# Patient Record
Sex: Female | Born: 1970 | Race: White | Hispanic: Yes | Marital: Married | Smoking: Never smoker
Health system: Southern US, Community
[De-identification: ages and names within clinical notes are randomized; demographics above are authoritative.]

---

## 2005-04-25 ENCOUNTER — Ambulatory Visit (HOSPITAL_COMMUNITY): Admission: RE | Admit: 2005-04-25 | Discharge: 2005-04-25 | Payer: Self-pay | Admitting: Obstetrics

## 2006-03-06 ENCOUNTER — Ambulatory Visit (HOSPITAL_COMMUNITY): Admission: RE | Admit: 2006-03-06 | Discharge: 2006-03-06 | Payer: Self-pay | Admitting: *Deleted

## 2006-03-09 ENCOUNTER — Ambulatory Visit (HOSPITAL_COMMUNITY): Admission: RE | Admit: 2006-03-09 | Discharge: 2006-03-09 | Payer: Self-pay | Admitting: Obstetrics

## 2006-10-23 ENCOUNTER — Inpatient Hospital Stay (HOSPITAL_COMMUNITY): Admission: AD | Admit: 2006-10-23 | Discharge: 2006-10-25 | Payer: Self-pay | Admitting: Obstetrics

## 2007-12-25 ENCOUNTER — Ambulatory Visit (HOSPITAL_COMMUNITY): Admission: RE | Admit: 2007-12-25 | Discharge: 2007-12-25 | Payer: Self-pay | Admitting: Obstetrics

## 2008-05-21 ENCOUNTER — Inpatient Hospital Stay (HOSPITAL_COMMUNITY): Admission: AD | Admit: 2008-05-21 | Discharge: 2008-05-23 | Payer: Self-pay | Admitting: Obstetrics

## 2010-11-16 ENCOUNTER — Ambulatory Visit
Admission: RE | Admit: 2010-11-16 | Discharge: 2010-11-16 | Disposition: A | Discharge status: Home / Self Care | Payer: No Typology Code available for payment source | Source: Ambulatory Visit | Attending: Specialist | Admitting: Specialist

## 2010-11-16 ENCOUNTER — Other Ambulatory Visit: Payer: Self-pay | Admitting: Specialist

## 2010-11-16 DIAGNOSIS — R7611 Nonspecific reaction to tuberculin skin test without active tuberculosis: Secondary | ICD-10-CM

## 2010-11-16 DIAGNOSIS — R6889 Other general symptoms and signs: Secondary | ICD-10-CM

## 2011-05-22 LAB — CBC
HCT: 36.5
MCHC: 33.3
MCV: 92.5
MCV: 93
RBC: 4.26
RDW: 14.2

## 2014-11-16 ENCOUNTER — Other Ambulatory Visit: Payer: Self-pay | Admitting: Obstetrics and Gynecology

## 2014-11-16 DIAGNOSIS — Z1231 Encounter for screening mammogram for malignant neoplasm of breast: Secondary | ICD-10-CM

## 2014-12-17 ENCOUNTER — Ambulatory Visit (HOSPITAL_COMMUNITY)
Admission: RE | Admit: 2014-12-17 | Discharge: 2014-12-17 | Disposition: A | Discharge status: Home / Self Care | Payer: Self-pay | Source: Ambulatory Visit | Attending: Obstetrics and Gynecology | Admitting: Obstetrics and Gynecology

## 2014-12-17 ENCOUNTER — Encounter (HOSPITAL_COMMUNITY): Payer: Self-pay

## 2014-12-17 VITALS — BP 120/74 | Temp 97.8°F | Ht 62.0 in | Wt 179.6 lb

## 2014-12-17 DIAGNOSIS — Z1239 Encounter for other screening for malignant neoplasm of breast: Secondary | ICD-10-CM

## 2014-12-17 DIAGNOSIS — Z1231 Encounter for screening mammogram for malignant neoplasm of breast: Secondary | ICD-10-CM

## 2014-12-17 NOTE — Patient Instructions (Signed)
Educational materials on self-breast awareness given. Explained to Doris Foley that she did not need a Pap smear today due to last Pap smear was 09/21/2014. Let her know [facility] will cover Pap smears every 3 years unless has a history of abnormal Pap smears. Let patient know the [facility] will follow up with her within the next couple weeks with results by letter or phone. Doris Foley verbalized understanding. Patient escorted to mammography for a screening mammogram.  [place], Kathaleen Maserhristine Poll, RN 9:35 AM

## 2014-12-17 NOTE — Progress Notes (Signed)
No complaints today.  Pap Smear:  Pap smear not completed today. Last Pap smear was 09/21/2014 at the free [facility] cancer screening [facility] and normal. Per patient has no history of an abnormal Pap smear. Last Pap smear result to be scanned into EPIC under media.  Physical exam: Breasts Breasts symmetrical. No skin abnormalities bilateral breasts. No nipple retraction bilateral breasts. No nipple discharge bilateral breasts. No lymphadenopathy. No lumps palpated bilateral breasts. No complaints of pain or tenderness on exam. Patient escorted to mammography for a screening mammogram.        Pelvic/Bimanual No Pap smear completed today since last Pap smear was 09/21/2014. Pap smear not indicated per B[facility]CP guidelines.

## 2015-01-07 ENCOUNTER — Ambulatory Visit (HOSPITAL_BASED_OUTPATIENT_CLINIC_OR_DEPARTMENT_OTHER): Payer: Self-pay

## 2015-01-07 ENCOUNTER — Other Ambulatory Visit: Payer: Self-pay

## 2015-01-07 VITALS — BP 126/76 | HR 62 | Temp 97.6°F | Resp 14 | Ht 61.5 in | Wt 178.4 lb

## 2015-01-07 DIAGNOSIS — Z0000 Encounter for general adult medical examination without abnormal findings: Secondary | ICD-10-CM

## 2015-01-07 LAB — HEMOGLOBIN A1C
Hgb A1c MFr Bld: 6.2 % — ABNORMAL HIGH (ref <5.7)
Mean Plasma Glucose: 131 mg/dL — ABNORMAL HIGH (ref <117)

## 2015-01-07 LAB — GLUCOSE (CC13): GLUCOSE: 113 mg/dL (ref 70–140)

## 2015-01-07 LAB — LIPID PANEL
Cholesterol: 139 mg/dL (ref 0–200)
HDL: 56 mg/dL (ref >=46)
LDL CALC: 64 mg/dL (ref 0–99)
TRIGLYCERIDES: 97 mg/dL (ref <150)
Total CHOL/HDL Ratio: 2.5 Ratio
VLDL: 19 mg/dL (ref 0–40)

## 2015-01-07 NOTE — Progress Notes (Signed)
[facility] and is currently a [facility] patient effective 12/17/2014 .   Clinical Measurements: Patient is 5 ft. 1 1/2 inches, weight 178.4 lbs, waist circumference 37 inches, and hip circumference 43.5 inches.   Medical History: Patient has no history of high cholesterol. Patient does not have a history of hypertension or diabetes. Per patient no diagnosed history of coronary heart disease, heart attack, heart failure, stroke/TIA, vascular disease or congenital heart defects.   Blood Pressure, Self-measurement: Patient states has no reason to check Blood pressure.  Nutrition Assessment: Patient stated that eats 0 to 3 fruits every day. Patient states she eats 2 servings of vegetables a day. Per patient states does eat 3 or more ounces of whole grains daily. Patient stated doesn't eat two or more servings of fish weekly. Patient states she does not drink more than 36 ounces or 450 calories of beverages with added sugars weekly. Patient stated that drinks water. Patient stated she does not watch her salt intake but does not really use much salt.  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 Patient says that she goes to gym four times a week and walks and runs on the treadmill. Patient does around 960 minutes of moderate exercise a week and does around 120 minutes of vigorous exercise.  Smoking Status: Patient has never smoked and is not exposed to smoke.   Quality of Life Assessment: In assessing patient's quality of life she stated that out of the past 30 days that she has felt her health is good all of them. Patient also stated that in the past 30 days that her mental health was good including stress, depression and problems with emotions for all days. Patient did state that out of the past 30 days she felt her physical or mental health had not kept her from doing her usual activities including self-care, work or  recreation.   Plan: Lab work will be done today including a lipid panel, blood glucose, and Hgb A1C. Will call lab results when they are finished. Will discuss risk reduction counseling when call results.

## 2015-01-07 NOTE — Patient Instructions (Signed)
Discussed what will result from lab work and vital signs. Will write lab results on number sheet when results called. Verbalized understanding. 

## 2015-01-08 ENCOUNTER — Telehealth: Payer: Self-pay

## 2015-01-08 NOTE — Telephone Encounter (Signed)
Called to inform about lab work from 01/07/15. Interpreter, Delorise RoyalsJulie Sowell informed patient: cholesterol- 139, HDL- 56, LDL- 64, triglycerides - 97, Bld Glucose -113 and HBG-A1C - 6.2. Did risk reduction counseling concerning carbohydrates and sugars. Patient stated that was interested in Health Coaching to set goal related to not developing diabetes. Set up health Coaching for May 23 at 2:30 PM at cancer center for nutrition and Activity.

## 2015-01-11 ENCOUNTER — Ambulatory Visit: Payer: Self-pay

## 2015-01-11 DIAGNOSIS — Z789 Other specified health status: Secondary | ICD-10-CM

## 2015-01-11 NOTE — Patient Instructions (Signed)
Patient will follow 1200 calorie diet plan. Will review all handouts and exercise/activity book. Will increase fiber in diet. Will decrease Carbohydrates to less than 200 per day. Will increase exercise. Will measure portion sizes. Will call if has any questions. Will call patient in three weeks. Patient verbalized understanding.

## 2015-01-11 NOTE — Progress Notes (Signed)
Patient returns today for Health Coaching regarding Nutrition for her borderline AIC and activity.  NUTRITION: Patient and I went over Know Your Health Numbers again.Patient reviewed A1C handout to see about prediabetes, normal and  diabetes range. Discussed that needed to lose at least 10% of body weight (18 lbs).Discussed increasing fiber in diet, reading labels, increasing activity and serving sizes.Explained to patient and showed her that she is overweight and has BMI of 33.2.  Discussed watching carbohydrates, how to count them, serving sizes and number of carbs per day allowance. Patient reviewed weight /activity chart. Patient went over 1,200 cal diet and we broke it down to the number of servings she could have. Patient received and reviewed the following handouts in Spanish or English: Carb counting Menu, prediabetes, A1C Exam, 1200 calorie meal plan, Serving Portions, and Carb Counting and meal planning. Gave patient measuring cup to measure serving sizes and demonstrated the serving sizes. Also, received cookbook, water bottle, and snack box.  ACTIVITY: Discussed activity and walking. Patient stated that had not been to gym as much as usual. Discussed walking 30 minutes or gym 3 to 5 days a week.  PLAN:  Increasing amount of walking per day and add other exercises to plan, Decrease carbohydrates in diet. Lose weight. Will return in 6 months to check A1C.

## 2018-06-03 ENCOUNTER — Inpatient Hospital Stay (HOSPITAL_COMMUNITY)
Admission: AD | Admit: 2018-06-03 | Discharge: 2018-06-03 | Disposition: A | Discharge status: Home / Self Care | Payer: Self-pay | Source: Ambulatory Visit | Attending: Obstetrics and Gynecology | Admitting: Obstetrics and Gynecology

## 2018-06-03 ENCOUNTER — Other Ambulatory Visit: Payer: Self-pay

## 2018-06-03 ENCOUNTER — Encounter (HOSPITAL_COMMUNITY): Payer: Self-pay

## 2018-06-03 DIAGNOSIS — N939 Abnormal uterine and vaginal bleeding, unspecified: Secondary | ICD-10-CM | POA: Insufficient documentation

## 2018-06-03 DIAGNOSIS — D500 Iron deficiency anemia secondary to blood loss (chronic): Secondary | ICD-10-CM | POA: Insufficient documentation

## 2018-06-03 LAB — URINALYSIS, MICROSCOPIC (REFLEX): RBC / HPF: >50 RBC/hpf (ref 0–5)

## 2018-06-03 LAB — CBC WITH DIFFERENTIAL/PLATELET
BASOS ABS: 0 10*3/uL (ref 0.0–0.1)
BASOS PCT: 0 %
Eosinophils Absolute: 0 10*3/uL (ref 0.0–0.5)
Eosinophils Relative: 0 %
HEMATOCRIT: 21.4 % — AB (ref 36.0–46.0)
HEMOGLOBIN: 7 g/dL — AB (ref 12.0–15.0)
LYMPHS PCT: 16 %
Lymphs Abs: 1.1 10*3/uL (ref 0.7–4.0)
MCH: 25.5 pg — ABNORMAL LOW (ref 26.0–34.0)
MCHC: 32.7 g/dL (ref 30.0–36.0)
MCV: 78.1 fL — AB (ref 80.0–100.0)
Monocytes Absolute: 0.2 10*3/uL (ref 0.1–1.0)
Monocytes Relative: 3 %
NEUTROS ABS: 5.6 10*3/uL (ref 1.7–7.7)
NEUTROS PCT: 81 %
Platelets: 259 10*3/uL (ref 150–400)
RBC: 2.74 MIL/uL — ABNORMAL LOW (ref 3.87–5.11)
RDW: 15.4 % (ref 11.5–15.5)
WBC: 6.9 10*3/uL (ref 4.0–10.5)
nRBC: 0 % (ref 0.0–0.2)

## 2018-06-03 LAB — URINALYSIS, ROUTINE W REFLEX MICROSCOPIC

## 2018-06-03 LAB — POCT PREGNANCY, URINE: PREG TEST UR: NEGATIVE

## 2018-06-03 MED ORDER — MEDROXYPROGESTERONE ACETATE 10 MG PO TABS: 10.0000 mg | ORAL_TABLET | Freq: Every day | ORAL | 0 refills | Status: DC

## 2018-06-03 NOTE — Discharge Instructions (Signed)
Sangrado uterino anormal (Abnormal Uterine Bleeding) El sangrado uterino anormal puede afectar a las mujeres que estn en diversas etapas de la vida, desde adolescentes, mujeres frtiles y Games developer, hasta mujeres que han llegado a la menopausia. Hay diversas clases de sangrado uterino que se consideran anormales, entre ellas:  Prdidas de sangre o [employer] perodos.  Hemorragias luego de Retail banker.  Sangrado abundante o ms que lo habitual.  Perodos que duran ms que lo normal.  Sangrado luego de la menopausia. Muchos casos de sangrado uterino anormal son leves y simples de tratar, mientras que otros son ms graves. El mdico debe evaluar cualquier clase de sangrado anormal. El tratamiento depender de la causa del sangrado. INSTRUCCIONES PARA EL CUIDADO EN EL HOGAR Controle su afeccin para ver si hay cambios. Las siguientes indicaciones ayudarn a Chief Strategy Officer que pueda sentir:  Evite las duchas vaginales y el uso de tampones segn las indicaciones del mdico.  Scioto compresas con frecuencia. Deber hacerse exmenes plvicos regulares y pruebas de Papanicolaou. Cumpla con todas las visitas de control y [employer] diagnsticos, segn le indique su mdico. SOLICITE ATENCIN MDICA SI:  El sangrado dura ms de 1 semana.  Se siente mareada por momentos.  SOLICITE ATENCIN MDICA DE INMEDIATO SI:  Se desmaya.  Debe cambiarse la compresa cada 15 a 30 minutos.  Siente dolor abdominal.  Doris Foley.  Se siente dbil o presenta sudoracin.  Elimina cogulos grandes por la vagina.  Comienza a sentir nuseas y [place].  ASEGRESE DE QUE:  Comprende estas instrucciones.  Controlar su afeccin.  Recibir ayuda de inmediato si no mejora o si empeora.  Esta informacin no tiene Marine scientist el consejo del mdico. Asegrese de hacerle al mdico cualquier pregunta que tenga. Document Released: 08/07/2005  Document Revised: 08/12/2013 Document Reviewed: 03/06/2013 Elsevier Interactive Patient Education  2017 [place].  Anemia Anemia is a condition in which you do not have enough red blood cells or hemoglobin. Hemoglobin is a substance in red blood cells that carries oxygen. When you do not have enough red blood cells or hemoglobin (are anemic), your body cannot get enough oxygen and your organs may not work properly. As a result, you may feel very tired or have other problems. What are the causes? Common causes of anemia include:  Excessive bleeding. Anemia can be caused by excessive bleeding inside or outside the body, including bleeding from the intestine or from periods in women.  Poor nutrition.  Long-lasting (chronic) kidney, thyroid, and liver disease.  Bone marrow disorders.  Cancer and treatments for cancer.  HIV (human immunodeficiency virus) and AIDS (acquired immunodeficiency syndrome).  Treatments for HIV and AIDS.  Spleen problems.  Blood disorders.  Infections, medicines, and autoimmune disorders that destroy red blood cells.  What are the signs or symptoms? Symptoms of this condition include:  Minor weakness.  Dizziness.  Headache.  Feeling heartbeats that are irregular or faster than normal (palpitations).  Shortness of breath, especially with exercise.  Paleness.  Cold sensitivity.  Indigestion.  Nausea.  Difficulty sleeping.  Difficulty concentrating.  Symptoms may occur suddenly or develop slowly. If your anemia is mild, you may not have symptoms. How is this diagnosed? This condition is diagnosed based on:  Blood tests.  Your medical history.  A physical exam.  Bone marrow biopsy.  Your health care provider may also check your stool (feces) for blood and may do additional testing to look for the cause of your bleeding. You may  also have other tests, including:  Imaging tests, such as a CT scan or  MRI.  Endoscopy.  Colonoscopy.  How is this treated? Treatment for this condition depends on the cause. If you continue to lose a lot of blood, you may need to be treated at a hospital. Treatment may include:  Taking supplements of iron, vitamin X82, or folic acid.  Taking a hormone medicine (erythropoietin) that can help to stimulate red blood cell growth.  Having a blood transfusion. This may be needed if you lose a lot of blood.  Making changes to your diet.  Having surgery to remove your spleen.  Follow these instructions at home:  Take over-the-counter and prescription medicines only as told by your health care provider.  Take supplements only as told by your health care provider.  Follow any diet instructions that you were given.  Keep all follow-up visits as told by your health care provider. This is important. Contact a health care provider if:  You develop new bleeding anywhere in the body. Get help right away if:  You are very weak.  You are short of breath.  You have pain in your abdomen or chest.  You are dizzy or feel faint.  You have trouble concentrating.  You have bloody or black, tarry stools.  You vomit repeatedly or you vomit up blood. Summary  Anemia is a condition in which you do not have enough red blood cells or enough of a substance in your red blood cells that carries oxygen (hemoglobin).  Symptoms may occur suddenly or develop slowly.  If your anemia is mild, you may not have symptoms.  This condition is diagnosed with blood tests as well as a medical history and physical exam. Other tests may be needed.  Treatment for this condition depends on the cause of the anemia. This information is not intended to replace advice given to you by your health care provider. Make sure you discuss any questions you have with your health care provider. Document Released: 09/14/2004 Document Revised: 09/08/2016 Document Reviewed: 09/08/2016 Elsevier  Interactive Patient Education  [employer].

## 2018-06-03 NOTE — MAU Provider Note (Signed)
History     CSN: [id number]  Arrival date and time: 06/03/18 1350   None     Chief Complaint  Patient presents with  . Vaginal Bleeding   HPI  Doris Foley is 47 y.o. [id number] presents for evaluation of vaginal bleeding that began 2 weeks ago.  She last had GYN care 2 years ago, doesn't have GYN MD.  She states she feels a little dizzy with bleeding.  Describes as heavy using 3-4 pads daily, has larger than quarter size clots. Neg for cramping/pain. Periods have been regular until this month.  Neg for hx of uterine fibroids.  Hx of low Fe, she reports last few times she has attempted to give blood, she was not able to because of her HGB.  History reviewed. No pertinent past medical history.  History reviewed. No pertinent surgical history.  Family History  Problem Relation Age of Onset  . Diabetes Mother   . Diabetes Father   . Diabetes Brother   . Diabetes Brother     Social History   Tobacco Use  . Smoking status: Never Smoker  . Smokeless tobacco: Never Used  Substance Use Topics  . Alcohol use: Yes    Comment: rarely  . Drug use: No    Allergies: No Known Allergies  No medications prior to admission.    Review of Systems  Constitutional: Negative for activity change, appetite change and fatigue.  Respiratory: Negative for shortness of breath.   Cardiovascular: Negative for chest pain.  Gastrointestinal: Negative for abdominal pain (cramping), nausea and vomiting.  Genitourinary: Positive for vaginal bleeding (heavy with clotting ). Negative for dysuria, frequency and menstrual problem (normal cycles until this month).  Neurological: Positive for light-headedness. Negative for syncope.   Physical Exam   Blood pressure 139/74, pulse 96, temperature 98 F (36.7 C), resp. rate 16, height 5\' 3"  (1.6 m), weight 76.2 kg, last menstrual period 05/19/2018.  Physical Exam  Nursing note and vitals reviewed. Constitutional: She is oriented to person, place, and  time. She appears well-developed and well-nourished. No distress.  HENT:  Head: Normocephalic.  Neck: Normal range of motion.  Cardiovascular: Normal rate.  Respiratory: Effort normal.  GI: Soft. She exhibits no distension and no mass. There is no tenderness. There is no rebound and no guarding.  Genitourinary: There is no rash, tenderness or lesion on the right labia. There is no rash, tenderness or lesion on the left labia. Uterus is not enlarged and not tender. Cervix exhibits no motion tenderness, no discharge and no friability. Right adnexum displays no mass, no tenderness and no fullness. Left adnexum displays no mass, no tenderness and no fullness. There is bleeding (small amount of bright red active bleeding with small clots. ) in the vagina. No tenderness in the vagina.  Neurological: She is alert and oriented to person, place, and time.  Skin: Skin is warm and dry.  Psychiatric: She has a normal mood and affect. Her behavior is normal. Thought content normal.   Results for orders placed or performed during the hospital encounter of 06/03/18 (from the past 24 hour(s))  Pregnancy, urine POC     Status: None   Collection Time: 06/03/18  2:21 PM  Result Value Ref Range   Preg Test, Ur NEGATIVE NEGATIVE  CBC with Differential/Platelet     Status: Abnormal (Preliminary result)   Collection Time: 06/03/18  2:43 PM  Result Value Ref Range   WBC 6.9 4.0 - 10.5 K/uL   RBC 2.74 (  L) 3.87 - 5.11 MIL/uL   Hemoglobin 7.0 (L) 12.0 - 15.0 g/dL   HCT 09.8 (L) 11.9 - 14.7 %   MCV 78.1 (L) 80.0 - 100.0 fL   MCH 25.5 (L) 26.0 - 34.0 pg   MCHC 32.7 30.0 - 36.0 g/dL   RDW 82.9 56.2 - 13.0 %   Platelets 259 150 - 400 K/uL   nRBC 0.0 0.0 - 0.2 %   Neutrophils Relative % 81 %   Neutro Abs 5.6 1.7 - 7.7 K/uL   Lymphocytes Relative 16 %   Lymphs Abs 1.1 0.7 - 4.0 K/uL   Monocytes Relative 3 %   Monocytes Absolute 0.2 0.1 - 1.0 K/uL   Eosinophils Relative 0 %   Eosinophils Absolute 0.0 0.0 - 0.5  K/uL   Basophils Relative 0 %   Basophils Absolute 0.0 0.0 - 0.1 K/uL   Other PENDING %    MAU Course  Procedures  MDM MSE Exam Labs  Assessment and Plan  A:  Abnormal uterine bleeding      Anemia  P:  Rx for Provera 10mg  for 10 days      Instructed to her to buy OTC FE and take bid.  Add stool softner as needed.       Message routed to Clinic to make appointment to establish care/ recheck HGB in 4-6 weeks       Encouraged to stay well hydrated.  Dennison Mascot Kailynne Ferrington 06/03/2018, 2:56 PM

## 2018-06-03 NOTE — MAU Note (Signed)
Pt presents to MAU with complaints of heavy vaginal bleeding for 2 weeks

## 2018-06-03 NOTE — MAU Note (Signed)
Pt c/o increased VB over the past 2 weeks, today with dizziness and "feeling weak".  Pt reports bleeding has gotten heavier and now has clots

## 2018-07-03 ENCOUNTER — Ambulatory Visit (INDEPENDENT_AMBULATORY_CARE_PROVIDER_SITE_OTHER): Payer: Self-pay | Admitting: Family Medicine

## 2018-07-03 ENCOUNTER — Encounter: Payer: Self-pay | Admitting: Family Medicine

## 2018-07-03 DIAGNOSIS — N920 Excessive and frequent menstruation with regular cycle: Secondary | ICD-10-CM | POA: Insufficient documentation

## 2018-07-03 MED ORDER — MEDROXYPROGESTERONE ACETATE 10 MG PO TABS: 10.0000 mg | ORAL_TABLET | Freq: Every day | ORAL | 0 refills | Status: AC

## 2018-07-03 NOTE — Assessment & Plan Note (Signed)
Given her lack of financial assistance--will try to arrange this and then get pap and endometrial sampling. Only 1 episode of bleeding, but bled to Hgb of 7. This is worrisome. Refilled her Provera, take as needed for bleeding. Consider IUD as well for control of bleeding without side effects.

## 2018-07-03 NOTE — Patient Instructions (Signed)
Menorrhagia Menorrhagia is when your menstrual periods are heavy or last longer than usual. Follow these instructions at home:  Only take medicine as told by your doctor.  Take any iron pills as told by your doctor. Heavy bleeding may cause low levels of iron in your body.  Do not take aspirin 1 week before or during your period. Aspirin can make the bleeding worse.  Lie down for a while if you change your tampon or pad more than once in 2 hours. This may help lessen the bleeding.  Eat a healthy diet and foods with iron. These foods include leafy green vegetables, meat, liver, eggs, and whole grain breads and cereals.  Do not try to lose weight. Wait until the heavy bleeding has stopped and your iron level is normal. Contact a doctor if:  You soak through a pad or tampon every 1 or 2 hours, and this happens every time you have a period.  You need to use pads and tampons at the same time because you are bleeding so much.  You need to change your pad or tampon during the night.  You have a period that lasts for more than 8 days.  You pass clots bigger than 1 inch (2.5 cm) wide.  You have irregular periods that happen more or less often than once a month.  You feel dizzy or pass out (faint).  You feel very weak or tired.  You feel short of breath or feel your heart is beating too fast when you exercise.  You feel sick to your stomach (nausea) and you throw up (vomit) while you are taking your medicine.  You have watery poop (diarrhea) while you are taking your medicine.  You have any problems that may be related to the medicine you are taking. Get help right away if:  You soak through 4 or more pads or tampons in 2 hours.  You have any bleeding while you are pregnant. This information is not intended to replace advice given to you by your health care provider. Make sure you discuss any questions you have with your health care provider. Document Released: 05/16/2008 Document  Revised: 01/13/2016 Document Reviewed: 02/06/2013 Elsevier Interactive Patient Education  2017 Elsevier Inc.  

## 2018-07-03 NOTE — Progress Notes (Signed)
   Subjective:    Patient ID: Doris Foley is a 47 y.o. female presenting with Follow-up  on 07/03/2018  HPI: Cycles previously monthly lasted 3-4 days. Has had some heavy bleeding with anemia. Hgb 7. Given Provera. Bleeding stopped after 5 days. Did not like how it made her feel. LMP 06/20/18--cycle getting heavier, took Provera again.  Review of Systems  Constitutional: Negative for chills and fever.  Respiratory: Negative for shortness of breath.   Cardiovascular: Negative for chest pain.  Gastrointestinal: Negative for abdominal pain, nausea and vomiting.  Genitourinary: Negative for dysuria.  Skin: Negative for rash.      Objective:    BP 133/71   Pulse 79   Ht 5\' 3"  (1.6 m)   Wt 171 lb 12.8 oz (77.9 kg)   LMP 06/20/2018 (Exact Date)   BMI 30.43 kg/m  Physical Exam  Constitutional: She is oriented to person, place, and time. She appears well-developed and well-nourished. No distress.  HENT:  Head: Normocephalic and atraumatic.  Eyes: No scleral icterus.  Neck: Neck supple.  Cardiovascular: Normal rate.  Pulmonary/Chest: Effort normal.  Abdominal: Soft.  Neurological: She is alert and oriented to person, place, and time.  Skin: Skin is warm and dry.  Psychiatric: She has a normal mood and affect.        Assessment & Plan:   Problem List Items Addressed This Visit      Unprioritized   Menorrhagia    Given her lack of financial assistance--will try to arrange this and then get pap and endometrial sampling. Only 1 episode of bleeding, but bled to Hgb of 7. This is worrisome. Refilled her Provera, take as needed for bleeding. Consider IUD as well for control of bleeding without side effects.         Total face-to-face time with patient: 20 minutes. Over 50% of encounter was spent on counseling and coordination of care. Return in about 4 weeks (around 07/31/2018) for needs financial assistance asap and f/u in 4 wks.  Doris Foley 07/03/2018 9:34 AM

## 2018-07-12 ENCOUNTER — Other Ambulatory Visit (HOSPITAL_COMMUNITY): Payer: Self-pay | Admitting: *Deleted

## 2018-07-12 DIAGNOSIS — Z1231 Encounter for screening mammogram for malignant neoplasm of breast: Secondary | ICD-10-CM

## 2018-08-07 ENCOUNTER — Ambulatory Visit: Payer: Self-pay | Admitting: Family Medicine

## 2018-09-04 ENCOUNTER — Ambulatory Visit: Payer: Self-pay | Admitting: Family Medicine

## 2018-09-04 ENCOUNTER — Encounter: Payer: Self-pay | Admitting: Family Medicine

## 2018-09-04 ENCOUNTER — Telehealth: Payer: Self-pay | Admitting: *Deleted

## 2018-09-04 NOTE — Progress Notes (Signed)
Patient did not keep appointment today. She will be called to reschedule.  

## 2018-09-04 NOTE — Telephone Encounter (Signed)
Called pt to inform her of missed appointment and to request she call the clinic to reschedule as soon as possible.  Pt did not pick up.  Left a message stating that I was calling to inform her of a missed appointment and request that she call the office to reschedule this appointment as soon as she can.   Letter sent.

## 2018-10-17 ENCOUNTER — Encounter (HOSPITAL_COMMUNITY): Payer: Self-pay

## 2018-10-17 ENCOUNTER — Ambulatory Visit (HOSPITAL_COMMUNITY)
Admission: RE | Admit: 2018-10-17 | Discharge: 2018-10-17 | Disposition: A | Discharge status: Home / Self Care | Payer: Self-pay | Source: Ambulatory Visit | Attending: Obstetrics and Gynecology | Admitting: Obstetrics and Gynecology

## 2018-10-17 ENCOUNTER — Ambulatory Visit
Admission: RE | Admit: 2018-10-17 | Discharge: 2018-10-17 | Disposition: A | Discharge status: Home / Self Care | Payer: No Typology Code available for payment source | Source: Ambulatory Visit | Attending: Obstetrics and Gynecology | Admitting: Obstetrics and Gynecology

## 2018-10-17 VITALS — BP 122/80

## 2018-10-17 DIAGNOSIS — Z1231 Encounter for screening mammogram for malignant neoplasm of breast: Secondary | ICD-10-CM

## 2018-10-17 DIAGNOSIS — Z1239 Encounter for other screening for malignant neoplasm of breast: Secondary | ICD-10-CM

## 2018-10-17 NOTE — Progress Notes (Signed)
No complaints today.   Pap Smear: Pap smear not completed today. Last Pap smear was in July 2018 in [place] and normal per patient. Per patient has no history of an abnormal Pap smear. Last Pap smear result is not in Epic. Previous Pap smear result 09/21/2014 is in Epic.  Physical exam: Breasts Breasts symmetrical. No skin abnormalities bilateral breasts. No nipple retraction bilateral breasts. No nipple discharge bilateral breasts. No lymphadenopathy. No lumps palpated bilateral breasts. No complaints of pain or tenderness on exam. Referred patient to the [facility] for a screening mammogram. Appointment scheduled for Thrusday, October 17, 2018 at 1640.        Pelvic/Bimanual No Pap smear completed today since last Pap smear was in July 2018 per patient. Pap smear not indicated per B[facility] guidelines.   Smoking History: Patient has never smoked.  Patient Navigation: Patient education provided. Access to services provided for patient through [facility] program.   Breast and Cervical Cancer Risk Assessment: Patient has no family history of breast cancer, known genetic mutations, or radiation treatment to the chest before age 76. Patient has no history of cervical dysplasia, immunocompromised, or DES exposure in-utero.  Risk Assessment    Risk Scores      10/17/2018   Last edited by: Lynnell Dike, LPN   5-year risk: 0.5 %   Lifetime risk: 5.3 %

## 2018-10-17 NOTE — Patient Instructions (Signed)
Explained breast self awareness with Blanchard Mane. Patient did not need a Pap smear today due to last Pap smear was in July 2018 per patient. Let her know [facility] will cover Pap smears every 3 years unless has a history of abnormal Pap smears. Referred patient to the [facility] for a screening mammogram. Appointment scheduled for Thrusday, October 17, 2018 at 1640. Patient aware of appointment and will be there. Let patient know the Breast Center will follow up with her within the next couple weeks with results of mammogram by letter or phone. Doris Foley verbalized understanding.  [name], [name], RN 3:19 PM

## 2018-10-23 ENCOUNTER — Encounter (HOSPITAL_COMMUNITY): Payer: Self-pay | Admitting: *Deleted

## 2019-10-08 ENCOUNTER — Other Ambulatory Visit: Payer: Self-pay

## 2019-10-08 DIAGNOSIS — Z1231 Encounter for screening mammogram for malignant neoplasm of breast: Secondary | ICD-10-CM

## 2019-11-06 ENCOUNTER — Ambulatory Visit: Payer: No Typology Code available for payment source

## 2019-11-11 ENCOUNTER — Ambulatory Visit: Payer: Self-pay | Admitting: Student

## 2019-11-11 ENCOUNTER — Ambulatory Visit
Admission: RE | Admit: 2019-11-11 | Discharge: 2019-11-11 | Disposition: A | Discharge status: Home / Self Care | Payer: No Typology Code available for payment source | Source: Ambulatory Visit | Attending: Obstetrics and Gynecology | Admitting: Obstetrics and Gynecology

## 2019-11-11 ENCOUNTER — Other Ambulatory Visit: Payer: Self-pay

## 2019-11-11 ENCOUNTER — Encounter: Payer: Self-pay | Admitting: Student

## 2019-11-11 VITALS — BP 116/74 | Temp 97.8°F | Wt 185.0 lb

## 2019-11-11 DIAGNOSIS — Z1231 Encounter for screening mammogram for malignant neoplasm of breast: Secondary | ICD-10-CM

## 2019-11-11 DIAGNOSIS — Z124 Encounter for screening for malignant neoplasm of cervix: Secondary | ICD-10-CM

## 2019-11-11 NOTE — Progress Notes (Signed)
[name] is a 49 y.o. [phone] female who presents to [facility] clinic today with no complaints.    Pap Smear: Pap smear completed today. Last Pap smear was 2018 in [place] and was normal per patient. Per patient has no history of an abnormal Pap smear. Last Pap smear result is not available in Epic.   Physical exam: Breasts Breasts symmetrical. No skin abnormalities bilateral breasts. No nipple retraction bilateral breasts. No nipple discharge bilateral breasts. No lymphadenopathy. No lumps palpated bilateral breasts.       Pelvic/Bimanual Ext Genitalia No lesions, no swelling and no discharge observed on external genitalia.        Vagina Vagina pink and normal texture. No lesions or discharge observed in vagina.        Cervix Cervix is present. Cervix pink and of normal texture. No discharge observed.    Uterus Uterus is present and palpable. Uterus in normal position and normal size.        Adnexae Bilateral ovaries present and palpable. No tenderness on palpation.         Rectovaginal No rectal exam completed today since patient had no rectal complaints. No skin abnormalities observed on exam.     Smoking History: Patient has never smoked. Not referred to quit line.    Patient Navigation: Patient education provided. Access to services provided for patient through [facility] program.   Colorectal Cancer Screening: Per patient has never had colonoscopy completed No complaints today.    Breast and Cervical Cancer Risk Assessment: Patient does not have family history of breast cancer, known genetic mutations, or radiation treatment to the chest before age 56. Patient does not have history of cervical dysplasia, immunocompromised, or DES exposure in-utero.  Risk Assessment    Risk Scores      11/11/2019 10/17/2018   Last edited by: Narda Rutherford, LPN Stoney Bang H, LPN   5-year risk: 0.5 % 0.5 %   Lifetime risk: 5.3 % 5.3 %          A: [facility]CP exam with pap  smear  P: Referred patient to the [facility] for a screening mammogram. Appointment scheduled later today.  Judeth Horn, NP 11/11/2019 1:22 PM

## 2019-11-12 LAB — CYTOLOGY - PAP
Adequacy: ABSENT
Comment: NEGATIVE
Diagnosis: NEGATIVE
High risk HPV: NEGATIVE

## 2019-11-17 ENCOUNTER — Telehealth: Payer: Self-pay

## 2019-11-17 NOTE — Telephone Encounter (Signed)
Normal pap result letter mailed.  °

## 2020-01-30 IMAGING — MG DIGITAL SCREENING BILATERAL MAMMOGRAM WITH TOMO AND CAD
8 series · 9 of 24 positions shown · non-contrast
Comparison: Previous exam(s).

CLINICAL DATA: Screening.

EXAM:
DIGITAL SCREENING BILATERAL MAMMOGRAM WITH TOMO AND CAD

[R MLO synth-2D]
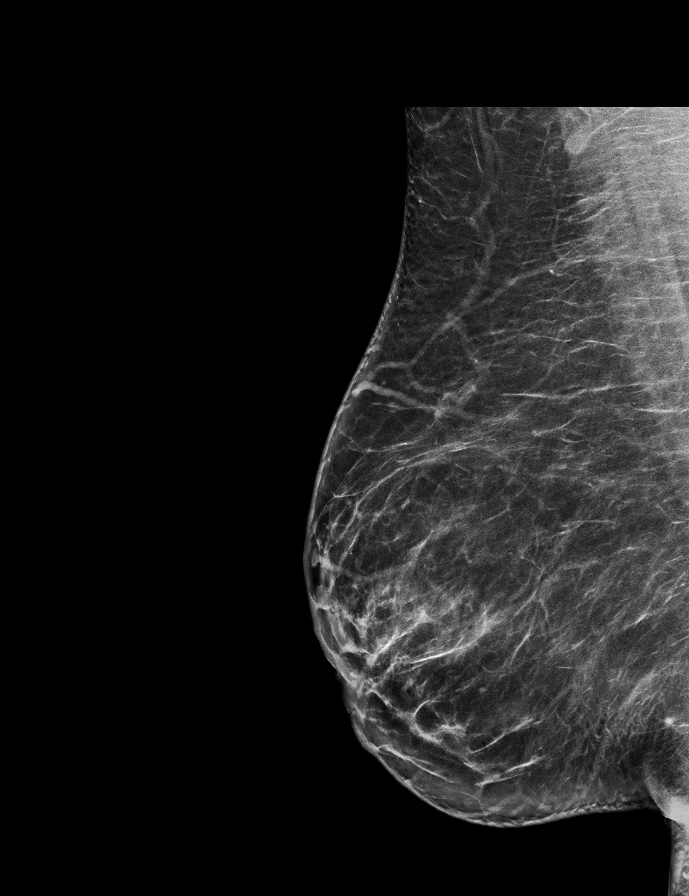

[R CC synth-2D]
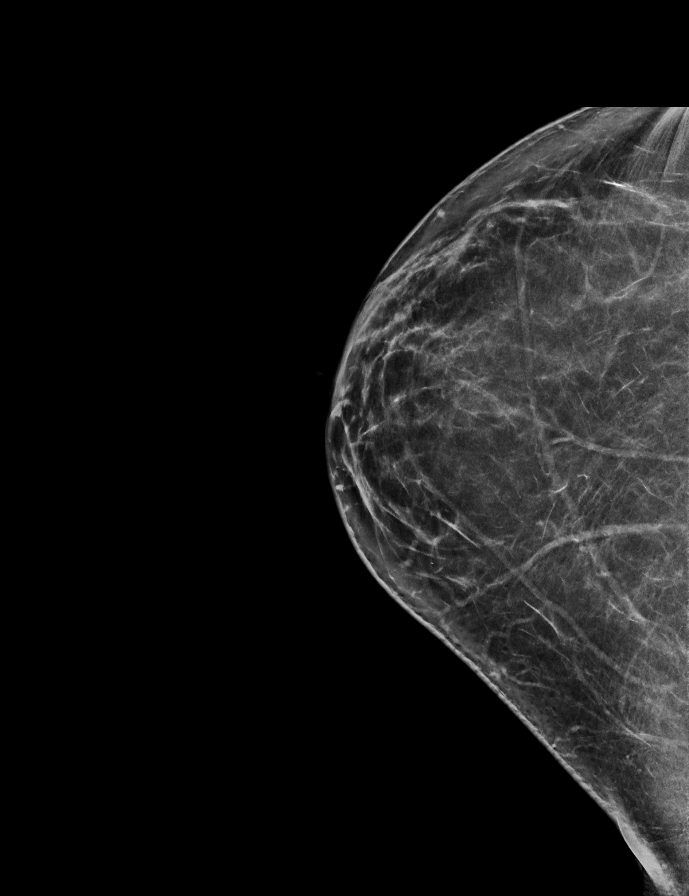

[L MLO synth-2D]
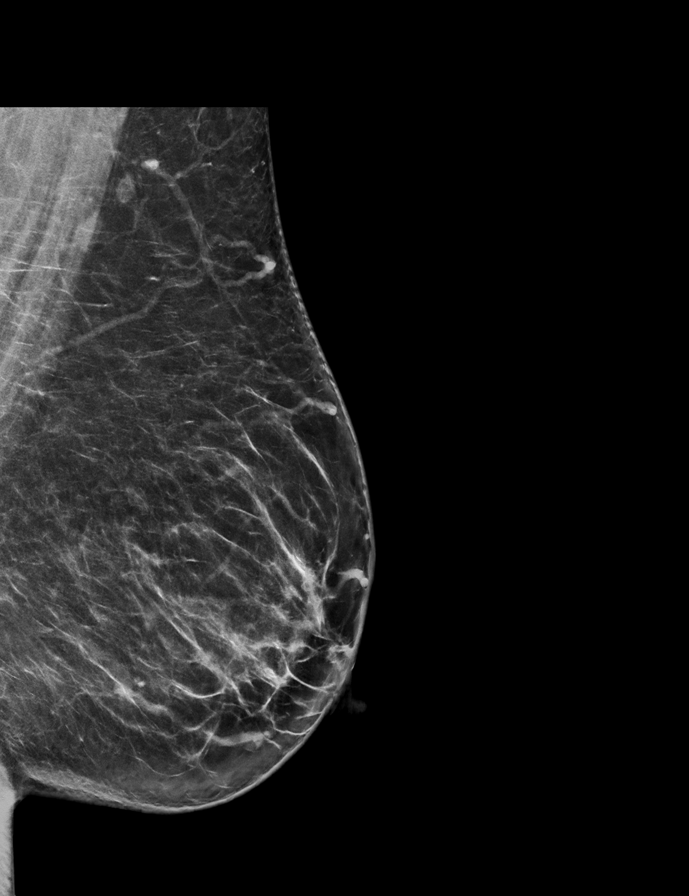

[L CC synth-2D]
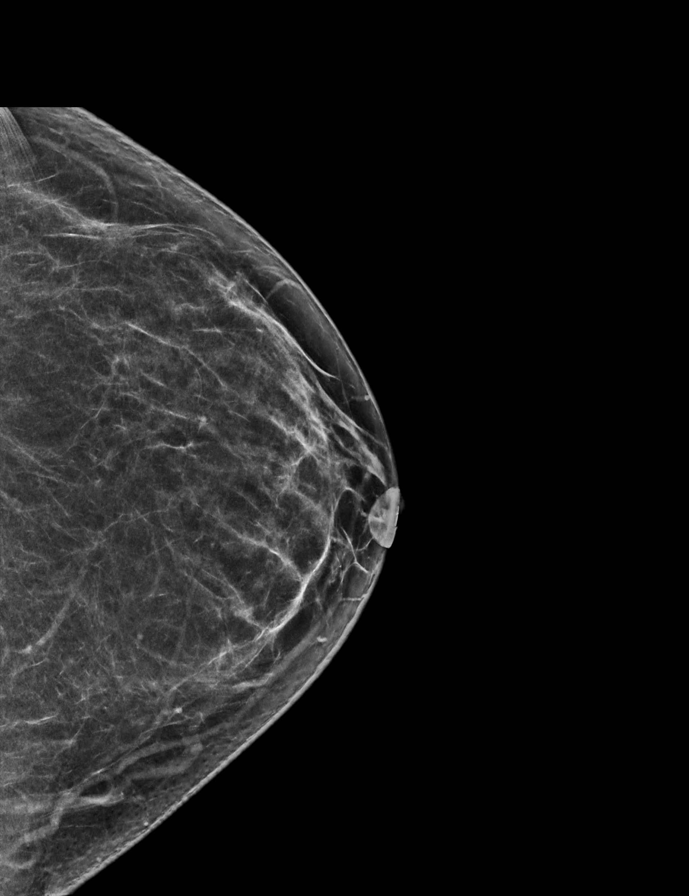

[R CC tomo · 2 of 73 frames shown]
[frame 24/73]
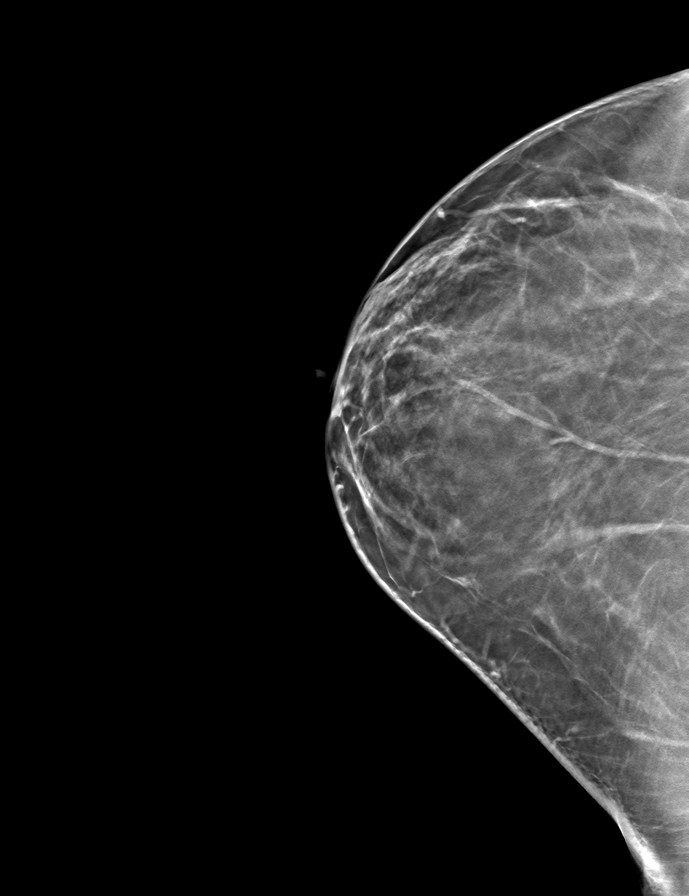
[frame 37/73]
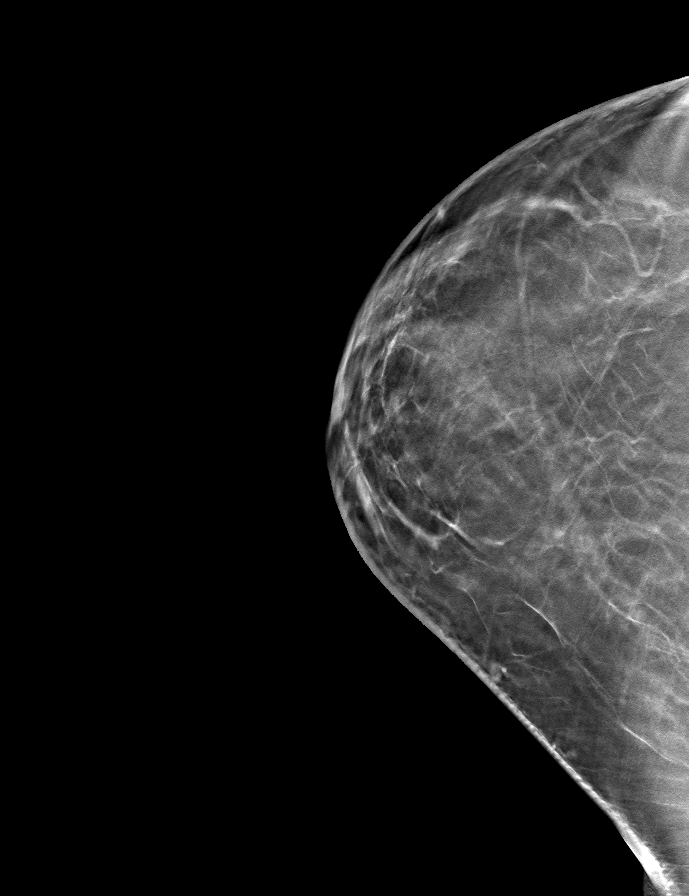

[R MLO tomo · tomo slice 40/79.0]
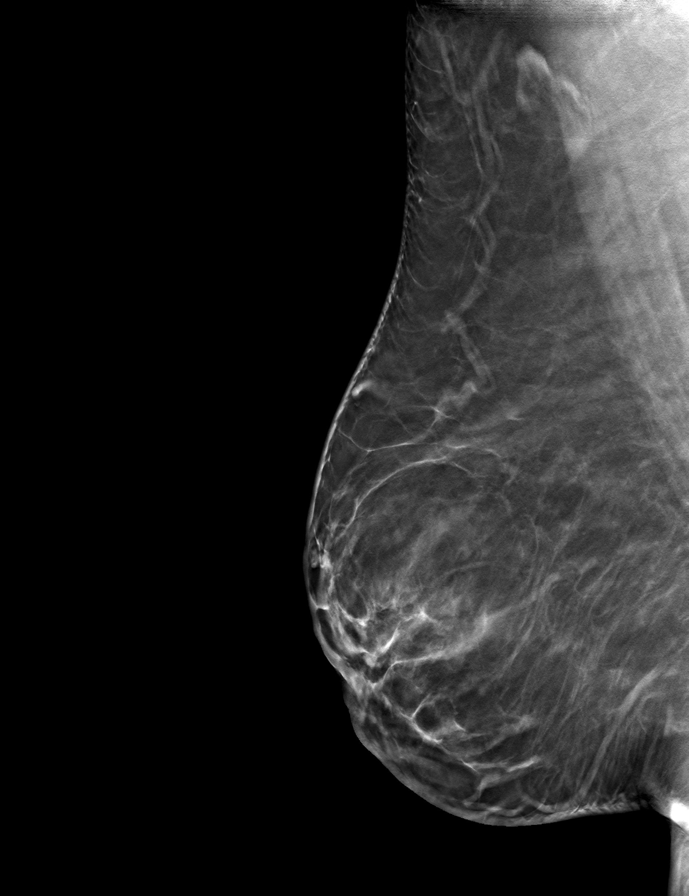

[L CC tomo · tomo slice 34/67.0]
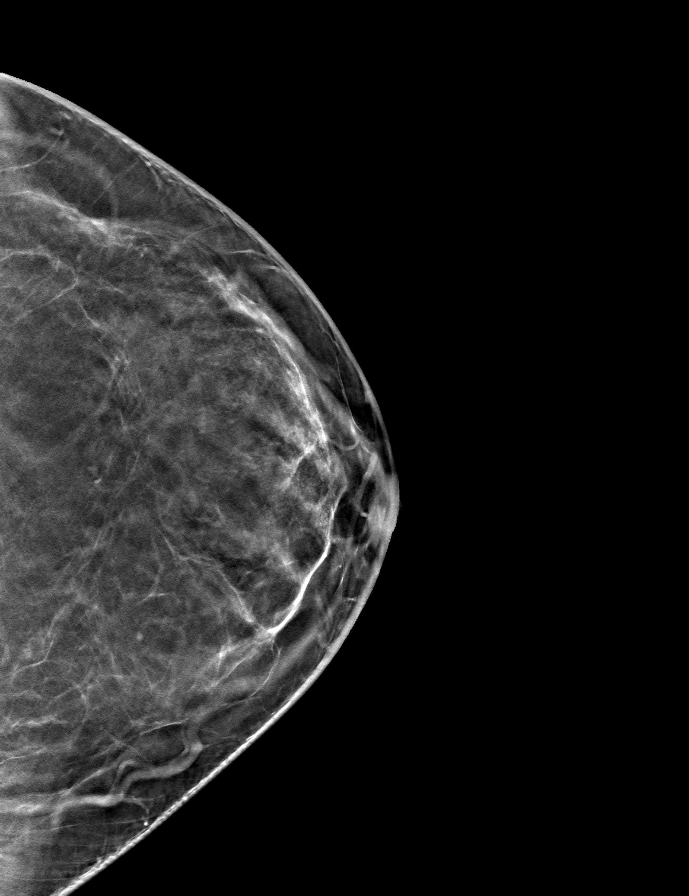

[L MLO tomo · tomo slice 38/75.0]
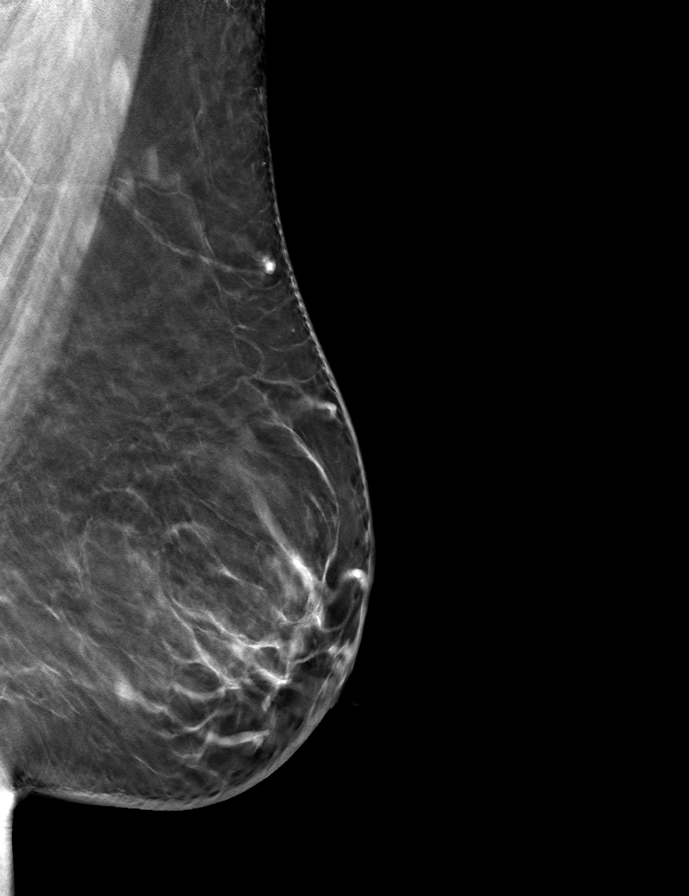

[9 of 24 positions shown; findings below may reference images not displayed]

ACR Breast Density Category b: There are scattered areas of
fibroglandular density.
FINDINGS: There are no findings suspicious for malignancy. Images were
processed with CAD.
IMPRESSION: No mammographic evidence of malignancy. A result letter of this
screening mammogram will be mailed directly to the patient.

RECOMMENDATION:
Screening mammogram in one year. (Code:[id number])

BI-RADS CATEGORY  1: Negative.

## 2020-11-05 ENCOUNTER — Other Ambulatory Visit: Payer: Self-pay | Admitting: Obstetrics and Gynecology

## 2020-11-05 DIAGNOSIS — Z1231 Encounter for screening mammogram for malignant neoplasm of breast: Secondary | ICD-10-CM

## 2020-12-02 ENCOUNTER — Ambulatory Visit: Payer: No Typology Code available for payment source

## 2020-12-07 ENCOUNTER — Ambulatory Visit: Payer: Self-pay | Admitting: *Deleted

## 2020-12-07 ENCOUNTER — Encounter (INDEPENDENT_AMBULATORY_CARE_PROVIDER_SITE_OTHER): Payer: Self-pay

## 2020-12-07 ENCOUNTER — Ambulatory Visit
Admission: RE | Admit: 2020-12-07 | Discharge: 2020-12-07 | Disposition: A | Discharge status: Home / Self Care | Payer: No Typology Code available for payment source | Source: Ambulatory Visit | Attending: Obstetrics and Gynecology | Admitting: Obstetrics and Gynecology

## 2020-12-07 ENCOUNTER — Other Ambulatory Visit: Payer: Self-pay

## 2020-12-07 VITALS — BP 122/86 | Wt 178.4 lb

## 2020-12-07 DIAGNOSIS — Z1239 Encounter for other screening for malignant neoplasm of breast: Secondary | ICD-10-CM

## 2020-12-07 DIAGNOSIS — Z1231 Encounter for screening mammogram for malignant neoplasm of breast: Secondary | ICD-10-CM

## 2020-12-07 NOTE — Patient Instructions (Signed)
Explained breast self awareness with Blanchard Mane. Patient did not need a Pap smear today due to last Pap smear and HPV typing was 3/32/2021. Let her know [facility] will cover Pap smears and HPV typing every 5 years unless has a history of abnormal Pap smears. Referred patient to the [facility] for a screening mammogram on the mobile unit. Appointment scheduled Tuesday, December 07, 2020 at 0930. Patient escorted to the mobile unit following B[facility]CP appointment for her screening mammogram. Let patient know the Breast Center will follow up with her within the next couple weeks with results of her mammogram by letter or phone. Doris Foley verbalized understanding.  Cadance Raus, [name], RN 9:11 AM

## 2020-12-07 NOTE — Progress Notes (Addendum)
[name] is a 50 y.o. female who presents to [facility] clinic today with no complaints.    Pap Smear: Pap smear not completed today. Last Pap smear was 11/11/2019 at [facility] clinic and was normal with negative HPV. Per patient has no history of an abnormal Pap smear. Last Pap smear result is available in Epic.   Physical exam: Breasts Breasts symmetrical. No skin abnormalities bilateral breasts. No nipple retraction bilateral breasts. No nipple discharge bilateral breasts. No lymphadenopathy. No lumps palpated bilateral breasts. No complaints of pain or tenderness on exam.      MS DIGITAL SCREENING TOMO BILATERAL  Result Date: 11/11/2019 CLINICAL DATA:  Screening. EXAM: DIGITAL SCREENING BILATERAL MAMMOGRAM WITH TOMO AND CAD COMPARISON:  Previous exam(s). ACR Breast Density Category b: There are scattered areas of fibroglandular density. FINDINGS: There are no findings suspicious for malignancy. Images were processed with CAD. IMPRESSION: No mammographic evidence of malignancy. A result letter of this screening mammogram will be mailed directly to the patient. RECOMMENDATION: Screening mammogram in one year. (Code:[id number]) BI-RADS CATEGORY  1: Negative. Electronically Signed   By: Sande Brothers M.D.   On: 11/11/2019 15:54   MS DIGITAL SCREENING TOMO BILATERAL  Result Date: 10/18/2018 CLINICAL DATA:  Screening. EXAM: DIGITAL SCREENING BILATERAL MAMMOGRAM WITH TOMO AND CAD COMPARISON:  Previous exam(s). ACR Breast Density Category b: There are scattered areas of fibroglandular density. FINDINGS: There are no findings suspicious for malignancy. Images were processed with CAD. IMPRESSION: No mammographic evidence of malignancy. A result letter of this screening mammogram will be mailed directly to the patient. RECOMMENDATION: Screening mammogram in one year. (Code:[id number]) BI-RADS CATEGORY  1: Negative. Electronically Signed   By: Sherian Rein M.D.   On: 10/18/2018 13:48    Pelvic/Bimanual Pap is  not indicated today per BCCCP guidelines.   Smoking History: Patient has never smoked.  Patient Navigation: Patient education provided. Access to services provided for patient through B[facility]CP program.   Colorectal Cancer Screening: Per patient has never had colonoscopy completed. No complaints today.    Breast and Cervical Cancer Risk Assessment: Patient does not have family history of breast cancer, known genetic mutations, or radiation treatment to the chest before age 27. Patient does not have history of cervical dysplasia, immunocompromised, or DES exposure in-utero.  Risk Assessment    Risk Scores      12/07/2020 11/11/2019   Last edited by: Meryl Dare, CMA McGill, Sherie Demetrius Charity, LPN   5-year risk: 0.6 % 0.5 %   Lifetime risk: 5.2 % 5.3 %          A: B[facility]CP exam without pap smear No complaints.  P: Referred patient to the [facility] for a screening mammogram on the mobile unit. Appointment scheduled Tuesday, December 07, 2020 at 0930.  Doris Heidelberg, RN 12/07/2020 9:11 AM
